# Patient Record
Sex: Male | Born: 2005 | Hispanic: No | Marital: Single
Health system: Southern US, Community
[De-identification: ages and names within clinical notes are randomized; demographics above are authoritative.]

## PROBLEM LIST (undated history)

## (undated) DIAGNOSIS — Z9109 Other allergy status, other than to drugs and biological substances: Secondary | ICD-10-CM

---
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
## 2023-04-23 DIAGNOSIS — J029 Acute pharyngitis, unspecified: Secondary | ICD-10-CM | POA: Diagnosis not present

## 2023-06-28 ENCOUNTER — Ambulatory Visit
Admission: RE | Admit: 2023-06-28 | Discharge: 2023-06-28 | Disposition: A | Discharge status: Home / Self Care | Payer: Medicaid Other | Source: Ambulatory Visit | Attending: Nurse Practitioner | Admitting: Nurse Practitioner

## 2023-06-28 ENCOUNTER — Other Ambulatory Visit: Payer: Self-pay

## 2023-06-28 VITALS — BP 117/58 | HR 67 | Temp 98.4°F | Resp 18

## 2023-06-28 DIAGNOSIS — B079 Viral wart, unspecified: Secondary | ICD-10-CM | POA: Diagnosis not present

## 2023-06-28 NOTE — ED Triage Notes (Signed)
Pt reports he has warts on left hand, left knee, left elbow and right hand that he would like to have removed.

## 2023-06-28 NOTE — Discharge Instructions (Addendum)
You have been diagnosed with warts.  You have been referred to primary care for further evaluation and treatment options due to limitations with our clinic.  A referral has been placed to assist you with finding a primary care provider.  They will be able to get you referred to dermatology which is a requirement by your insurance.

## 2023-06-28 NOTE — ED Provider Notes (Signed)
[facility]    CSN: [id number] Arrival date & time: 06/28/23  1103      History   Chief Complaint Chief Complaint  Patient presents with   Warts    HPI Matthew Travis is a 17 y.o. male.   HPI  He is in today for removal of warts.  He has multiple warts.  He has tried over-the-counter methods that were not effective.  He reports he is not sure why they are there. 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 No pertinent surgical history.     Home Medications    Prior to Admission medications   Medication Sig Start Date End Date Taking? Authorizing Provider  cetirizine (ZYRTEC ALLERGY) 10 MG tablet Take 1 tablet (10 mg total) by mouth daily. Patient not taking: Reported on 06/28/2023 02/27/20   Hall-Potvin, [place], PA-C  fluticasone (FLONASE) 50 MCG/ACT nasal spray Place 1 spray into both nostrils daily. Patient not taking: Reported on 06/28/2023 02/27/20   Hall-Potvin, [place], PA-C  ibuprofen (ADVIL) 800 MG tablet Take 1 tablet (800 mg total) by mouth 2 (two) times daily as needed for moderate pain (swelling). Patient not taking: Reported on 06/28/2023 12/04/19   Bing Neighbors, NP    Family History Family History  Problem Relation Age of Onset   Healthy Father    Thyroid disease Mother     Social History Social History   Tobacco Use   Smoking status: Passive Smoke Exposure - Never Smoker   Tobacco comments:    mom smokes - patient stays with family friend most of the time  Vaping Use   Vaping status: Every Day  Substance Use Topics   Alcohol use: Not Currently   Drug use: Yes    Types: Marijuana     Allergies   Patient has no known allergies.   Review of Systems Review of Systems   Physical Exam Triage Vital Signs ED Triage Vitals  Encounter Vitals Group      BP 06/28/23 1127 (!) 117/58     Systolic BP Percentile --      Diastolic BP Percentile --      Pulse Rate 06/28/23 1127 67     Resp 06/28/23 1127 18     Temp 06/28/23 1127 98.4 F (36.9 C)     Temp Source 06/28/23 1127 Oral     SpO2 06/28/23 1127 97 %     Weight --      Height --      Head Circumference --      Peak Flow --      Pain Score 06/28/23 1124 0     Pain Loc --      Pain Education --      Exclude from Growth Chart --    No data found.  Updated Vital Signs BP (!) 117/58 (BP Location: Left Arm)   Pulse 67   Temp 98.4 F (36.9 C) (Oral)   Resp 18   SpO2 97%   Visual Acuity Right Eye Distance:   Left Eye Distance:   Bilateral Distance:    Right Eye Near:   Left Eye Near:    Bilateral Near:     Physical Exam HENT:     Head: Normocephalic and atraumatic.  Cardiovascular:     Rate and Rhythm:  Normal rate.  Pulmonary:     Effort: Pulmonary effort is normal.  Musculoskeletal:        General: Normal range of motion.  Skin:    General: Skin is warm.     Capillary Refill: Capillary refill takes less than 2 seconds.       Neurological:     General: No focal deficit present.     Mental Status: He is alert and oriented to person, place, and time.  Psychiatric:        Mood and Affect: Mood normal.        Behavior: Behavior normal.      UC Treatments / Results  Labs (all labs ordered are listed, but only abnormal results are displayed) Labs Reviewed - No data to display  EKG   Radiology No results found.  Procedures Procedures (including critical care time)  Medications Ordered in UC Medications - No data to display  Initial Impression / Assessment and Plan / UC Course  I have reviewed the triage vital signs and the nursing notes.  Pertinent labs & imaging results that were available during my care of the patient were reviewed by me and considered in my medical decision making (see chart for details).     Wart removal Final Clinical  Impressions(s) / UC Diagnoses   Final diagnoses:  Viral warts, unspecified type     Discharge Instructions      You have been diagnosed with warts.  You have been referred to primary care for further evaluation and treatment options due to limitations with our clinic.  A referral has been placed to assist you with finding a primary care provider.  They will be able to get you referred to dermatology which is a requirement by your insurance.      ED Prescriptions   None    PDMP not reviewed this encounter.   Thad Ranger [place], [place] 06/28/23 [phone]

## 2023-07-23 DIAGNOSIS — B079 Viral wart, unspecified: Secondary | ICD-10-CM | POA: Diagnosis not present

## 2023-07-23 DIAGNOSIS — B081 Molluscum contagiosum: Secondary | ICD-10-CM | POA: Diagnosis not present

## 2023-09-27 DIAGNOSIS — Z113 Encounter for screening for infections with a predominantly sexual mode of transmission: Secondary | ICD-10-CM | POA: Diagnosis not present

## 2023-12-28 DIAGNOSIS — B079 Viral wart, unspecified: Secondary | ICD-10-CM | POA: Diagnosis not present

## 2024-05-12 DIAGNOSIS — Z113 Encounter for screening for infections with a predominantly sexual mode of transmission: Secondary | ICD-10-CM | POA: Diagnosis not present

## 2024-05-12 DIAGNOSIS — R112 Nausea with vomiting, unspecified: Secondary | ICD-10-CM | POA: Diagnosis not present

## 2024-05-12 DIAGNOSIS — R109 Unspecified abdominal pain: Secondary | ICD-10-CM | POA: Diagnosis not present

## 2024-05-12 DIAGNOSIS — R5383 Other fatigue: Secondary | ICD-10-CM | POA: Diagnosis not present

## 2024-05-19 ENCOUNTER — Ambulatory Visit
Admission: RE | Admit: 2024-05-19 | Discharge: 2024-05-19 | Disposition: A | Discharge status: Home / Self Care | Source: Ambulatory Visit | Attending: Family Medicine | Admitting: Family Medicine

## 2024-05-19 ENCOUNTER — Other Ambulatory Visit: Payer: Self-pay | Admitting: Family Medicine

## 2024-05-19 DIAGNOSIS — R109 Unspecified abdominal pain: Secondary | ICD-10-CM

## 2024-05-19 DIAGNOSIS — R1110 Vomiting, unspecified: Secondary | ICD-10-CM | POA: Diagnosis not present

## 2024-06-15 DIAGNOSIS — B079 Viral wart, unspecified: Secondary | ICD-10-CM | POA: Diagnosis not present

## 2024-07-10 ENCOUNTER — Ambulatory Visit: Admission: EM | Admit: 2024-07-10 | Discharge: 2024-07-10 | Discharge status: Against Medical Advice

## 2024-07-10 NOTE — ED Triage Notes (Signed)
2nd call, not in lobby.

## 2024-07-10 NOTE — ED Notes (Signed)
 Called x 1 for room. Not in lobby. LVM

## 2024-07-10 NOTE — ED Notes (Signed)
 Spoke to pt by phone who states I am 10 minutes away at my sister's house. Informed him he would need to check back in if still wanted to be seen. Explained that while it is okay to wait in the car while waiting to be seen, patients can't leave the property
